# Patient Record
Sex: Female | Born: 1985 | Race: White | Hispanic: No | Marital: Married
Health system: Southern US, Community
[De-identification: ages and names within clinical notes are randomized; demographics above are authoritative.]

---

## 2006-08-04 ENCOUNTER — Emergency Department (HOSPITAL_COMMUNITY): Admission: EM | Admit: 2006-08-04 | Discharge: 2006-08-04 | Payer: Self-pay | Admitting: Emergency Medicine

## 2008-03-15 ENCOUNTER — Emergency Department (HOSPITAL_COMMUNITY): Admission: EM | Admit: 2008-03-15 | Discharge: 2008-03-15 | Payer: Self-pay | Admitting: Emergency Medicine

## 2008-07-26 ENCOUNTER — Inpatient Hospital Stay (HOSPITAL_COMMUNITY): Admission: AD | Admit: 2008-07-26 | Discharge: 2008-07-26 | Payer: Self-pay | Admitting: Obstetrics & Gynecology

## 2008-10-09 ENCOUNTER — Inpatient Hospital Stay (HOSPITAL_COMMUNITY): Admission: AD | Admit: 2008-10-09 | Discharge: 2008-10-10 | Payer: Self-pay | Admitting: Obstetrics & Gynecology

## 2008-10-18 ENCOUNTER — Ambulatory Visit (HOSPITAL_COMMUNITY): Admission: RE | Admit: 2008-10-18 | Discharge: 2008-10-18 | Payer: Self-pay | Admitting: Obstetrics

## 2008-12-22 ENCOUNTER — Inpatient Hospital Stay (HOSPITAL_COMMUNITY): Admission: AD | Admit: 2008-12-22 | Discharge: 2008-12-23 | Payer: Self-pay | Admitting: Obstetrics & Gynecology

## 2008-12-28 ENCOUNTER — Ambulatory Visit (HOSPITAL_COMMUNITY): Admission: RE | Admit: 2008-12-28 | Discharge: 2008-12-28 | Payer: Self-pay | Admitting: Obstetrics

## 2008-12-30 ENCOUNTER — Inpatient Hospital Stay (HOSPITAL_COMMUNITY): Admission: AD | Admit: 2008-12-30 | Discharge: 2008-12-30 | Payer: Self-pay | Admitting: Obstetrics & Gynecology

## 2009-01-25 ENCOUNTER — Inpatient Hospital Stay (HOSPITAL_COMMUNITY): Admission: AD | Admit: 2009-01-25 | Discharge: 2009-01-25 | Payer: Self-pay | Admitting: Obstetrics

## 2009-01-27 ENCOUNTER — Inpatient Hospital Stay (HOSPITAL_COMMUNITY): Admission: AD | Admit: 2009-01-27 | Discharge: 2009-01-28 | Payer: Self-pay | Admitting: Obstetrics

## 2009-02-02 ENCOUNTER — Ambulatory Visit (HOSPITAL_COMMUNITY): Admission: RE | Admit: 2009-02-02 | Discharge: 2009-02-02 | Payer: Self-pay | Admitting: Obstetrics

## 2009-02-10 ENCOUNTER — Inpatient Hospital Stay (HOSPITAL_COMMUNITY): Admission: AD | Admit: 2009-02-10 | Discharge: 2009-02-11 | Payer: Self-pay | Admitting: Obstetrics & Gynecology

## 2009-03-06 ENCOUNTER — Inpatient Hospital Stay (HOSPITAL_COMMUNITY): Admission: RE | Admit: 2009-03-06 | Discharge: 2009-03-10 | Payer: Self-pay | Admitting: Obstetrics

## 2009-03-07 ENCOUNTER — Encounter: Payer: Self-pay | Admitting: Obstetrics

## 2010-01-09 ENCOUNTER — Emergency Department (HOSPITAL_COMMUNITY): Admission: EM | Admit: 2010-01-09 | Discharge: 2010-01-09 | Payer: Self-pay | Admitting: Emergency Medicine

## 2010-04-09 ENCOUNTER — Inpatient Hospital Stay (HOSPITAL_COMMUNITY)
Admission: AD | Admit: 2010-04-09 | Discharge: 2010-04-10 | Payer: Self-pay | Source: Home / Self Care | Admitting: Obstetrics

## 2010-04-25 ENCOUNTER — Ambulatory Visit: Payer: Self-pay | Admitting: Obstetrics & Gynecology

## 2010-07-16 LAB — GC/CHLAMYDIA PROBE AMP, GENITAL
Chlamydia, DNA Probe: NEGATIVE
GC Probe Amp, Genital: NEGATIVE

## 2010-07-16 LAB — POCT PREGNANCY, URINE: Preg Test, Ur: NEGATIVE

## 2010-07-16 LAB — HCG, QUANTITATIVE, PREGNANCY: hCG, Beta Chain, Quant, S: 14 m[IU]/mL — ABNORMAL HIGH (ref <5)

## 2010-07-16 LAB — WET PREP, GENITAL: Trich, Wet Prep: NONE SEEN

## 2010-07-16 LAB — URINALYSIS, ROUTINE W REFLEX MICROSCOPIC
Glucose, UA: NEGATIVE mg/dL
Nitrite: NEGATIVE
Urobilinogen, UA: 0.2 mg/dL (ref 0.0–1.0)

## 2010-07-16 LAB — CBC
HCT: 29.1 % — ABNORMAL LOW (ref 36.0–46.0)
Hemoglobin: 8.8 g/dL — ABNORMAL LOW (ref 12.0–15.0)
MCH: 18 pg — ABNORMAL LOW (ref 26.0–34.0)

## 2010-07-16 LAB — URINE MICROSCOPIC-ADD ON

## 2010-07-16 LAB — ABO/RH: ABO/RH(D): B POS

## 2010-08-07 LAB — COMPREHENSIVE METABOLIC PANEL
AST: 29 U/L (ref 0–37)
Alkaline Phosphatase: 136 U/L — ABNORMAL HIGH (ref 39–117)
BUN: 6 mg/dL (ref 6–23)
Calcium: 9.1 mg/dL (ref 8.4–10.5)
Chloride: 110 mEq/L (ref 96–112)
Creatinine, Ser: 0.43 mg/dL (ref 0.4–1.2)
Potassium: 4.4 mEq/L (ref 3.5–5.1)
Total Bilirubin: 0.2 mg/dL — ABNORMAL LOW (ref 0.3–1.2)
Total Protein: 5.6 g/dL — ABNORMAL LOW (ref 6.0–8.3)

## 2010-08-07 LAB — CBC
Hemoglobin: 11.9 g/dL — ABNORMAL LOW (ref 12.0–15.0)
MCHC: 32.3 g/dL (ref 30.0–36.0)
MCHC: 33.1 g/dL (ref 30.0–36.0)
MCV: 77.9 fL — ABNORMAL LOW (ref 78.0–100.0)
MCV: 78.7 fL (ref 78.0–100.0)
Platelets: 146 10*3/uL — ABNORMAL LOW (ref 150–400)
Platelets: 159 10*3/uL (ref 150–400)
RBC: 2.57 MIL/uL — ABNORMAL LOW (ref 3.87–5.11)
RDW: 15.9 % — ABNORMAL HIGH (ref 11.5–15.5)
WBC: 11.3 10*3/uL — ABNORMAL HIGH (ref 4.0–10.5)

## 2010-08-07 LAB — CCBB MATERNAL DONOR DRAW

## 2010-08-07 LAB — LACTATE DEHYDROGENASE: LDH: 217 U/L (ref 94–250)

## 2010-08-10 LAB — PROTIME-INR: Prothrombin Time: 11.9 seconds (ref 11.6–15.2)

## 2010-08-10 LAB — CBC
HCT: 35.3 % — ABNORMAL LOW (ref 36.0–46.0)
Platelets: 152 10*3/uL (ref 150–400)
RDW: 19.9 % — ABNORMAL HIGH (ref 11.5–15.5)

## 2010-08-10 LAB — URINALYSIS, ROUTINE W REFLEX MICROSCOPIC
Ketones, ur: NEGATIVE mg/dL
Nitrite: NEGATIVE
Protein, ur: NEGATIVE mg/dL

## 2010-08-10 LAB — COMPREHENSIVE METABOLIC PANEL
Albumin: 2.9 g/dL — ABNORMAL LOW (ref 3.5–5.2)
Alkaline Phosphatase: 79 U/L (ref 39–117)
BUN: 2 mg/dL — ABNORMAL LOW (ref 6–23)
Potassium: 3.6 mEq/L (ref 3.5–5.1)
Sodium: 136 mEq/L (ref 135–145)
Total Protein: 5.6 g/dL — ABNORMAL LOW (ref 6.0–8.3)

## 2010-08-10 LAB — APTT: aPTT: 25 seconds (ref 24–37)

## 2010-08-10 LAB — D-DIMER, QUANTITATIVE: D-Dimer, Quant: 0.35 ug/mL-FEU (ref 0.00–0.48)

## 2010-08-12 LAB — URINALYSIS, ROUTINE W REFLEX MICROSCOPIC
Ketones, ur: NEGATIVE mg/dL
Nitrite: NEGATIVE
Protein, ur: NEGATIVE mg/dL
Urobilinogen, UA: 0.2 mg/dL (ref 0.0–1.0)
pH: 5.5 (ref 5.0–8.0)

## 2010-08-12 LAB — DIFFERENTIAL
Eosinophils Absolute: 0.1 10*3/uL (ref 0.0–0.7)
Eosinophils Relative: 1 % (ref 0–5)
Lymphocytes Relative: 23 % (ref 12–46)
Lymphs Abs: 2.4 10*3/uL (ref 0.7–4.0)
Monocytes Absolute: 0.5 10*3/uL (ref 0.1–1.0)
Monocytes Relative: 5 % (ref 3–12)

## 2010-08-12 LAB — CBC
HCT: 28.8 % — ABNORMAL LOW (ref 36.0–46.0)
Hemoglobin: 9.3 g/dL — ABNORMAL LOW (ref 12.0–15.0)
MCV: 67.8 fL — ABNORMAL LOW (ref 78.0–100.0)
RBC: 4.25 MIL/uL (ref 3.87–5.11)
WBC: 10.8 10*3/uL — ABNORMAL HIGH (ref 4.0–10.5)

## 2010-08-12 LAB — WET PREP, GENITAL: Yeast Wet Prep HPF POC: NONE SEEN

## 2010-08-15 LAB — URINALYSIS, ROUTINE W REFLEX MICROSCOPIC
Bilirubin Urine: NEGATIVE
Specific Gravity, Urine: 1.025 (ref 1.005–1.030)
pH: 6 (ref 5.0–8.0)

## 2010-08-15 LAB — URINE MICROSCOPIC-ADD ON

## 2010-08-15 LAB — CBC
HCT: 30.5 % — ABNORMAL LOW (ref 36.0–46.0)
Hemoglobin: 9.3 g/dL — ABNORMAL LOW (ref 12.0–15.0)
MCHC: 30.4 g/dL (ref 30.0–36.0)
MCV: 63.6 fL — ABNORMAL LOW (ref 78.0–100.0)
Platelets: 231 10*3/uL (ref 150–400)
RBC: 4.79 MIL/uL (ref 3.87–5.11)

## 2010-08-15 LAB — ABO/RH: ABO/RH(D): B POS

## 2010-08-15 LAB — GC/CHLAMYDIA PROBE AMP, GENITAL
Chlamydia, DNA Probe: NEGATIVE
GC Probe Amp, Genital: NEGATIVE

## 2010-08-15 LAB — DIFFERENTIAL
Basophils Relative: 0 % (ref 0–1)
Eosinophils Absolute: 0.1 10*3/uL (ref 0.0–0.7)
Lymphs Abs: 2.8 10*3/uL (ref 0.7–4.0)
Neutrophils Relative %: 71 % (ref 43–77)

## 2010-08-15 LAB — WET PREP, GENITAL: Clue Cells Wet Prep HPF POC: NONE SEEN

## 2010-08-15 LAB — HCG, QUANTITATIVE, PREGNANCY: hCG, Beta Chain, Quant, S: 85246 m[IU]/mL — ABNORMAL HIGH (ref <5)

## 2011-07-15 IMAGING — US US OB FOLLOW-UP
1 series · 14 of 28 positions shown · non-contrast
Comparison: none

OBSTETRICAL ULTRASOUND:
 This ultrasound exam was performed in the [HOSPITAL] Ultrasound Department.  The OB US report was generated in the AS system, and faxed to the ordering physician.  This report is also available in [REDACTED] PACS.

[Series 1: us ob follow up · 0.32mm/px · 14 of 33 slices shown]
[im 2/33]
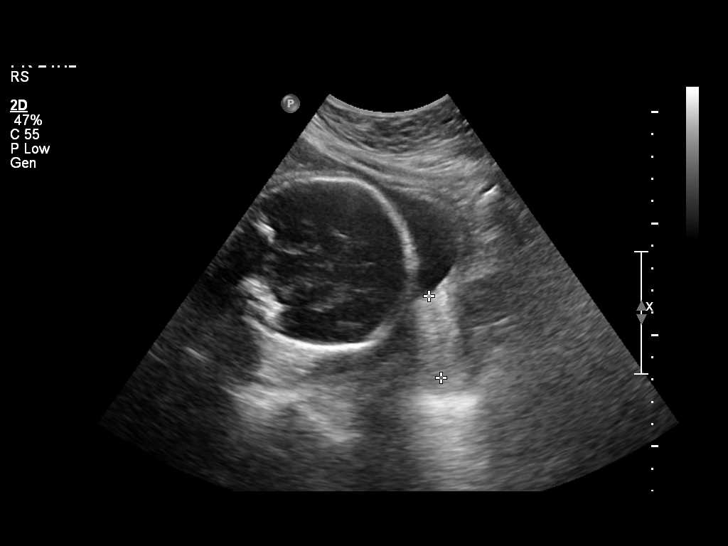
[im 4/33]
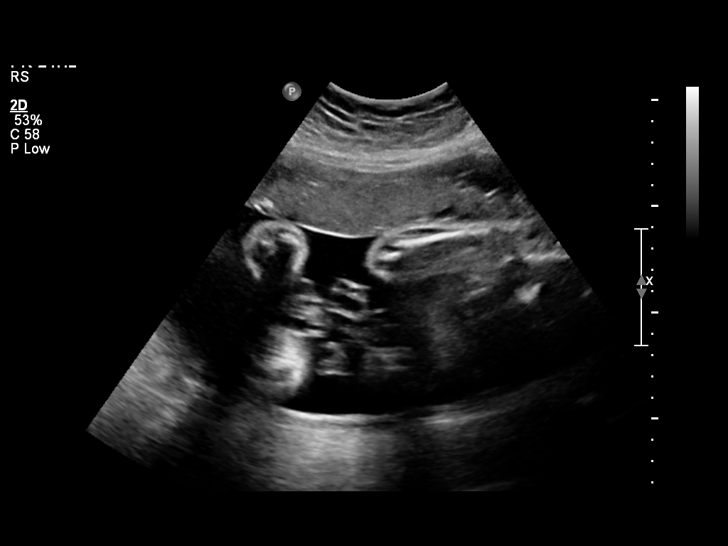
[im 6/33]
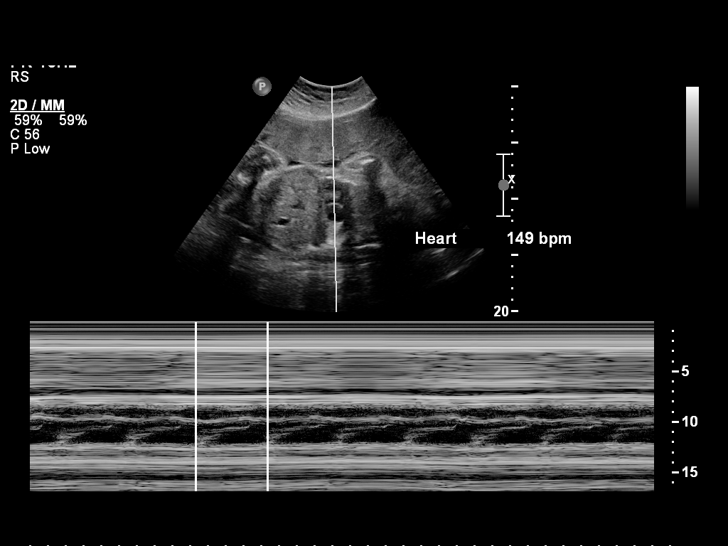
[im 9/33]
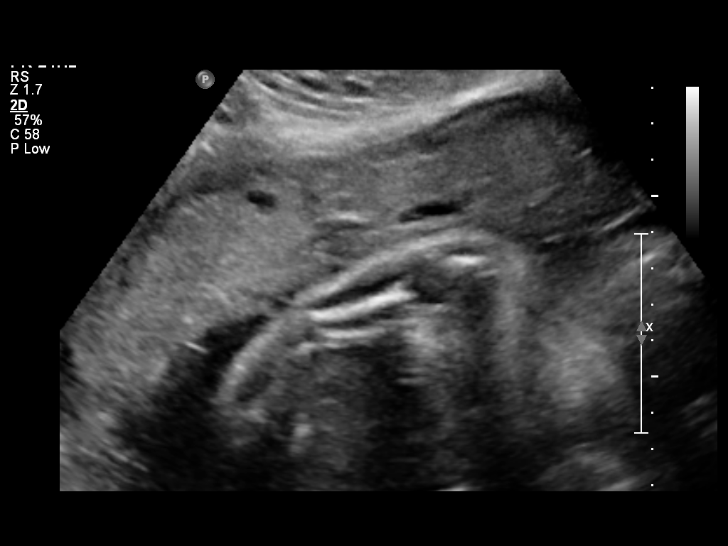
[im 11/33]
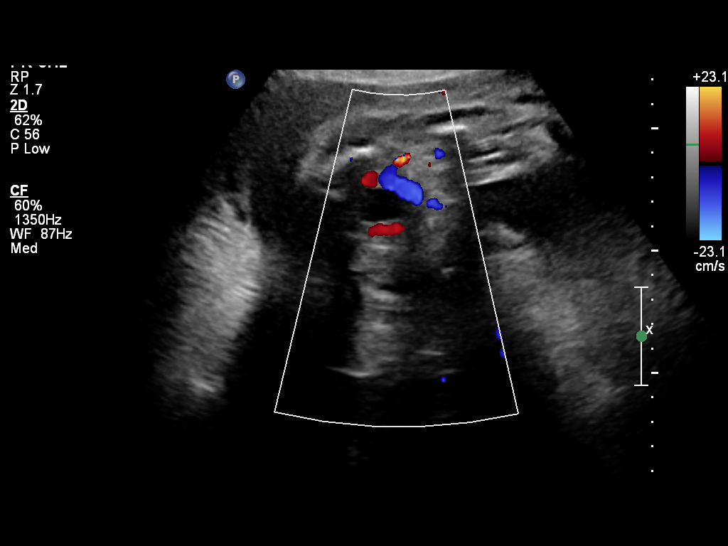
[im 14/33]
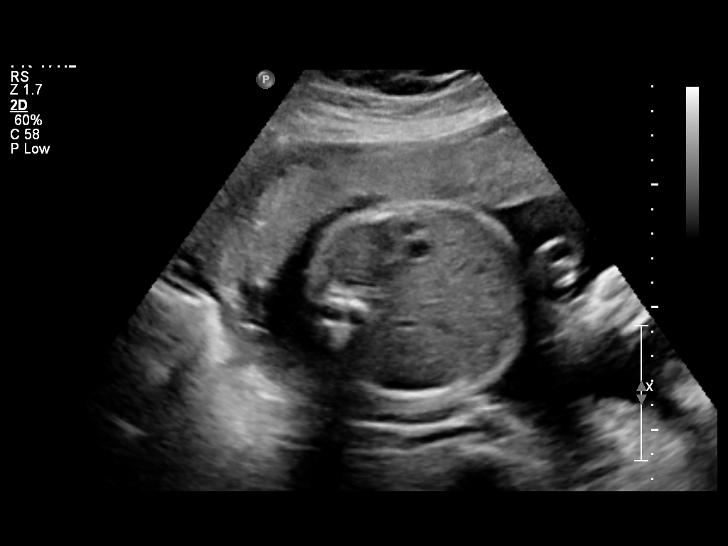
[im 16/33]
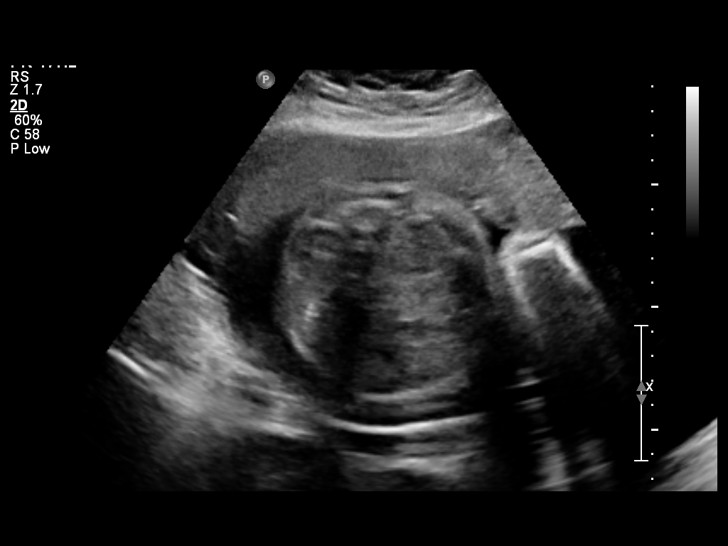
[im 18/33]
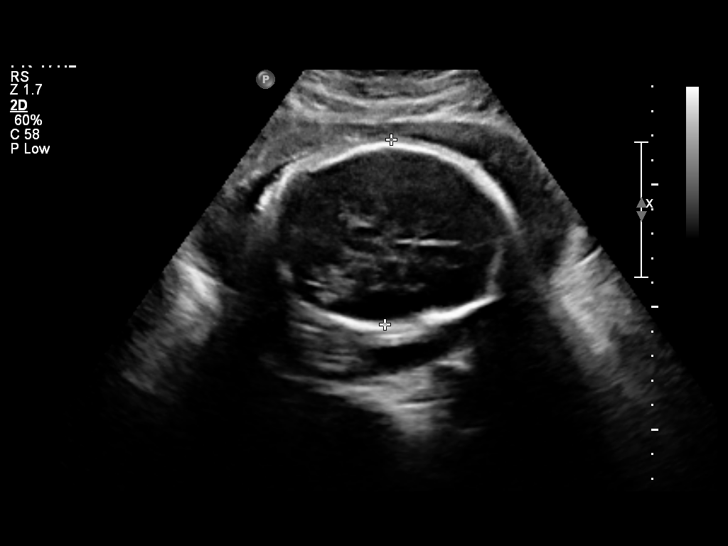
[im 21/33]
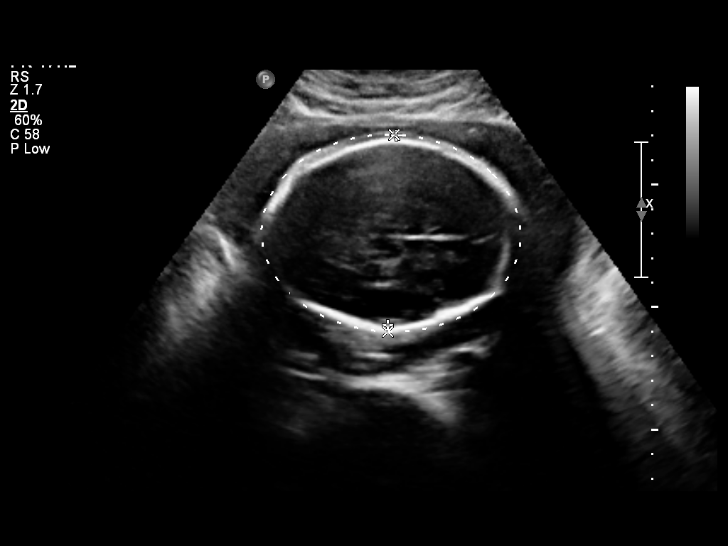
[im 23/33]
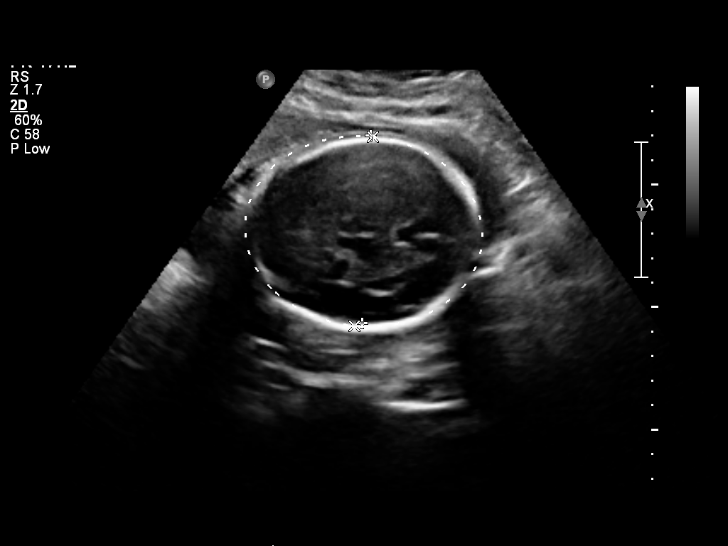
[im 25/33]
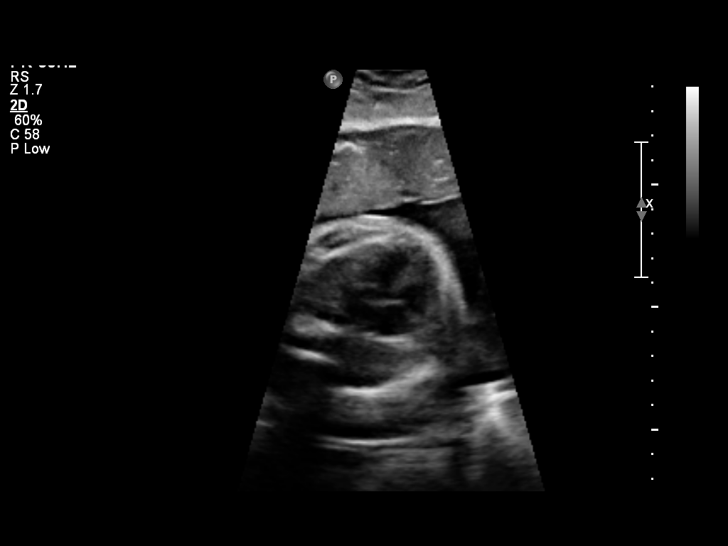
[im 28/33]
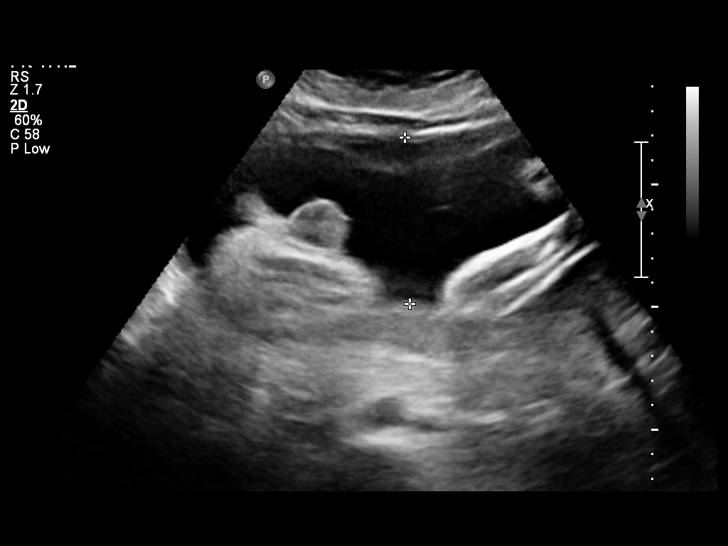
[im 30/33]
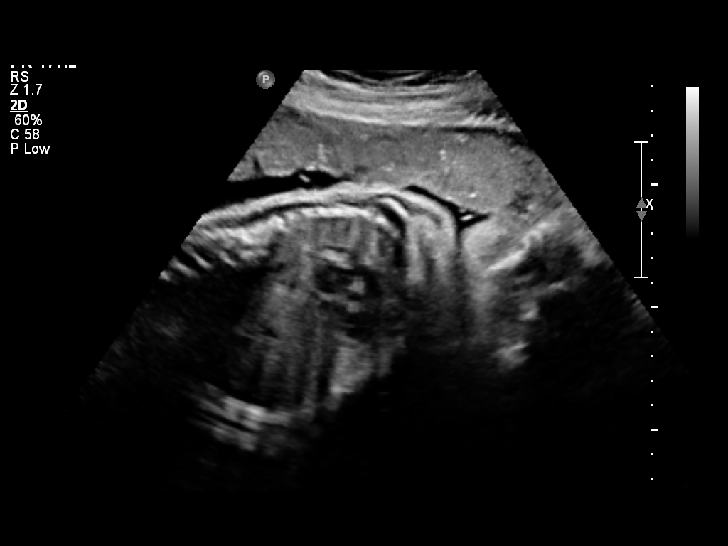
[im 33/33]
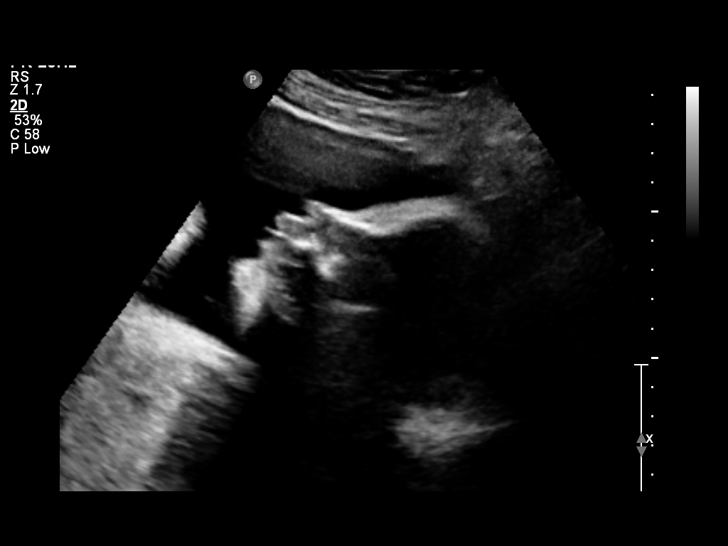

[14 of 28 positions shown; findings below may reference images not displayed]

IMPRESSION: See [facility] Obstetric US report.

## 2015-12-04 NOTE — Progress Notes (Signed)
This encounter was created in error - please disregard.
